# Patient Record
Sex: Male | Born: 1990 | Race: White | Hispanic: No | Marital: Single | Smoking: Current every day smoker
Health system: Southern US, Community
[De-identification: ages and names within clinical notes are randomized; demographics above are authoritative.]

---

## 2014-07-29 ENCOUNTER — Encounter (HOSPITAL_BASED_OUTPATIENT_CLINIC_OR_DEPARTMENT_OTHER): Payer: Self-pay | Admitting: *Deleted

## 2014-07-29 ENCOUNTER — Encounter (HOSPITAL_COMMUNITY): Admission: EM | Disposition: A | Discharge status: Home / Self Care | Payer: Self-pay | Source: Home / Self Care

## 2014-07-29 ENCOUNTER — Observation Stay (HOSPITAL_BASED_OUTPATIENT_CLINIC_OR_DEPARTMENT_OTHER)
Admission: EM | Admit: 2014-07-29 | Discharge: 2014-07-30 | Disposition: A | Discharge status: Home / Self Care | Payer: Self-pay | Attending: Surgery | Admitting: Surgery

## 2014-07-29 ENCOUNTER — Emergency Department (HOSPITAL_BASED_OUTPATIENT_CLINIC_OR_DEPARTMENT_OTHER): Payer: Self-pay

## 2014-07-29 DIAGNOSIS — R509 Fever, unspecified: Secondary | ICD-10-CM | POA: Insufficient documentation

## 2014-07-29 DIAGNOSIS — E7439 Other disorders of intestinal carbohydrate absorption: Secondary | ICD-10-CM | POA: Insufficient documentation

## 2014-07-29 DIAGNOSIS — R1031 Right lower quadrant pain: Secondary | ICD-10-CM | POA: Insufficient documentation

## 2014-07-29 DIAGNOSIS — F319 Bipolar disorder, unspecified: Secondary | ICD-10-CM | POA: Insufficient documentation

## 2014-07-29 DIAGNOSIS — Z79899 Other long term (current) drug therapy: Secondary | ICD-10-CM | POA: Insufficient documentation

## 2014-07-29 DIAGNOSIS — K353 Acute appendicitis with localized peritonitis: Principal | ICD-10-CM | POA: Insufficient documentation

## 2014-07-29 DIAGNOSIS — F17210 Nicotine dependence, cigarettes, uncomplicated: Secondary | ICD-10-CM | POA: Insufficient documentation

## 2014-07-29 DIAGNOSIS — Z880 Allergy status to penicillin: Secondary | ICD-10-CM | POA: Insufficient documentation

## 2014-07-29 DIAGNOSIS — K3580 Unspecified acute appendicitis: Secondary | ICD-10-CM

## 2014-07-29 HISTORY — PX: LAPAROSCOPIC APPENDECTOMY: SHX408

## 2014-07-29 LAB — CBC WITH DIFFERENTIAL/PLATELET
BASOS ABS: 0 10*3/uL (ref 0.0–0.1)
BASOS PCT: 0 % (ref 0–1)
EOS ABS: 0.1 10*3/uL (ref 0.0–0.7)
Eosinophils Relative: 1 % (ref 0–5)
HEMATOCRIT: 40.3 % (ref 39.0–52.0)
Hemoglobin: 13.4 g/dL (ref 13.0–17.0)
LYMPHS ABS: 1.5 10*3/uL (ref 0.7–4.0)
Lymphocytes Relative: 8 % — ABNORMAL LOW (ref 12–46)
MCH: 27.7 pg (ref 26.0–34.0)
MCHC: 33.3 g/dL (ref 30.0–36.0)
MCV: 83.3 fL (ref 78.0–100.0)
Monocytes Absolute: 1.2 10*3/uL — ABNORMAL HIGH (ref 0.1–1.0)
Monocytes Relative: 6 % (ref 3–12)
NEUTROS PCT: 85 % — AB (ref 43–77)
Neutro Abs: 15.7 10*3/uL — ABNORMAL HIGH (ref 1.7–7.7)
Platelets: 230 10*3/uL (ref 150–400)
RBC: 4.84 MIL/uL (ref 4.22–5.81)
RDW: 13.8 % (ref 11.5–15.5)
WBC: 18.6 10*3/uL — ABNORMAL HIGH (ref 4.0–10.5)

## 2014-07-29 LAB — URINALYSIS, ROUTINE W REFLEX MICROSCOPIC
Bilirubin Urine: NEGATIVE
Glucose, UA: NEGATIVE mg/dL
Hgb urine dipstick: NEGATIVE
KETONES UR: NEGATIVE mg/dL
Leukocytes, UA: NEGATIVE
Nitrite: NEGATIVE
PH: 6 (ref 5.0–8.0)
Protein, ur: NEGATIVE mg/dL
Specific Gravity, Urine: 1.025 (ref 1.005–1.030)
Urobilinogen, UA: 0.2 mg/dL (ref 0.0–1.0)

## 2014-07-29 LAB — COMPREHENSIVE METABOLIC PANEL
ALT: 36 U/L (ref 17–63)
ANION GAP: 4 — AB (ref 5–15)
AST: 44 U/L — ABNORMAL HIGH (ref 15–41)
Albumin: 4.2 g/dL (ref 3.5–5.0)
Alkaline Phosphatase: 76 U/L (ref 38–126)
BILIRUBIN TOTAL: 1.4 mg/dL — AB (ref 0.3–1.2)
BUN: 14 mg/dL (ref 6–20)
CHLORIDE: 103 mmol/L (ref 101–111)
CO2: 28 mmol/L (ref 22–32)
CREATININE: 0.93 mg/dL (ref 0.61–1.24)
Calcium: 8.9 mg/dL (ref 8.9–10.3)
GFR calc Af Amer: >60 mL/min (ref >60)
Glucose, Bld: 128 mg/dL — ABNORMAL HIGH (ref 65–99)
Potassium: 4.1 mmol/L (ref 3.5–5.1)
Sodium: 135 mmol/L (ref 135–145)
Total Protein: 7.7 g/dL (ref 6.5–8.1)

## 2014-07-29 LAB — SURGICAL PCR SCREEN
MRSA, PCR: NEGATIVE
STAPHYLOCOCCUS AUREUS: NEGATIVE

## 2014-07-29 LAB — LIPASE, BLOOD: LIPASE: 21 U/L — AB (ref 22–51)

## 2014-07-29 SURGERY — APPENDECTOMY, LAPAROSCOPIC
Anesthesia: General | Site: Abdomen

## 2014-07-29 MED ORDER — METRONIDAZOLE IN NACL 5-0.79 MG/ML-% IV SOLN
500.0000 mg | Freq: Once | INTRAVENOUS | Status: AC
  Administered 2014-07-29: 500 mg via INTRAVENOUS
  Filled 2014-07-29: qty 100

## 2014-07-29 MED ORDER — DEXAMETHASONE SODIUM PHOSPHATE 10 MG/ML IJ SOLN
INTRAMUSCULAR | Status: AC
  Filled 2014-07-29: qty 2

## 2014-07-29 MED ORDER — HYDROMORPHONE HCL 1 MG/ML IJ SOLN
1.0000 mg | Freq: Once | INTRAMUSCULAR | Status: AC
  Administered 2014-07-29: 1 mg via INTRAVENOUS
  Filled 2014-07-29: qty 1

## 2014-07-29 MED ORDER — IOHEXOL 300 MG/ML  SOLN
100.0000 mL | Freq: Once | INTRAMUSCULAR | Status: AC | PRN
  Administered 2014-07-29: 100 mL via INTRAVENOUS

## 2014-07-29 MED ORDER — FENTANYL CITRATE (PF) 100 MCG/2ML IJ SOLN
INTRAMUSCULAR | Status: AC
  Filled 2014-07-29: qty 2

## 2014-07-29 MED ORDER — NEOSTIGMINE METHYLSULFATE 10 MG/10ML IV SOLN
INTRAVENOUS | Status: AC
  Filled 2014-07-29: qty 1

## 2014-07-29 MED ORDER — MORPHINE SULFATE 4 MG/ML IJ SOLN
4.0000 mg | Freq: Once | INTRAMUSCULAR | Status: AC
  Administered 2014-07-29: 4 mg via INTRAVENOUS
  Filled 2014-07-29: qty 1

## 2014-07-29 MED ORDER — ONDANSETRON HCL 4 MG/2ML IJ SOLN
INTRAMUSCULAR | Status: AC
  Filled 2014-07-29: qty 2

## 2014-07-29 MED ORDER — CIPROFLOXACIN IN D5W 400 MG/200ML IV SOLN
400.0000 mg | Freq: Once | INTRAVENOUS | Status: AC
Start: 2014-07-29 — End: 2014-07-30
  Administered 2014-07-30: 400 mg via INTRAVENOUS
  Filled 2014-07-29: qty 200

## 2014-07-29 MED ORDER — FENTANYL CITRATE (PF) 250 MCG/5ML IJ SOLN
INTRAMUSCULAR | Status: AC
  Filled 2014-07-29: qty 5

## 2014-07-29 MED ORDER — GLYCOPYRROLATE 0.2 MG/ML IJ SOLN
INTRAMUSCULAR | Status: AC
  Filled 2014-07-29: qty 2

## 2014-07-29 MED ORDER — IOHEXOL 300 MG/ML  SOLN
25.0000 mL | Freq: Once | INTRAMUSCULAR | Status: AC | PRN
  Administered 2014-07-29: 25 mL via ORAL

## 2014-07-29 MED ORDER — MIDAZOLAM HCL 2 MG/2ML IJ SOLN
INTRAMUSCULAR | Status: AC
  Filled 2014-07-29: qty 2

## 2014-07-29 MED ORDER — BUPIVACAINE-EPINEPHRINE 0.25% -1:200000 IJ SOLN
INTRAMUSCULAR | Status: AC
  Filled 2014-07-29: qty 1

## 2014-07-29 MED ORDER — KCL IN DEXTROSE-NACL 20-5-0.45 MEQ/L-%-% IV SOLN
INTRAVENOUS | Status: DC
  Administered 2014-07-29: 22:00:00 via INTRAVENOUS
  Filled 2014-07-29 (×2): qty 1000

## 2014-07-29 MED ORDER — ONDANSETRON HCL 4 MG/2ML IJ SOLN
4.0000 mg | Freq: Once | INTRAMUSCULAR | Status: AC
  Administered 2014-07-29: 4 mg via INTRAVENOUS
  Filled 2014-07-29: qty 2

## 2014-07-29 MED ORDER — LACTATED RINGERS IV SOLN
INTRAVENOUS | Status: DC | PRN
  Administered 2014-07-29 – 2014-07-30 (×2): via INTRAVENOUS

## 2014-07-29 MED ORDER — ROCURONIUM BROMIDE 100 MG/10ML IV SOLN
INTRAVENOUS | Status: AC
  Filled 2014-07-29: qty 1

## 2014-07-29 MED ORDER — ARTIFICIAL TEARS OP OINT
TOPICAL_OINTMENT | OPHTHALMIC | Status: AC
  Filled 2014-07-29: qty 3.5

## 2014-07-29 SURGICAL SUPPLY — 35 items
APPLIER CLIP ROT 10 11.4 M/L (STAPLE)
BENZOIN TINCTURE PRP APPL 2/3 (GAUZE/BANDAGES/DRESSINGS) ×3 IMPLANT
CLIP APPLIE ROT 10 11.4 M/L (STAPLE) IMPLANT
CLOSURE WOUND 1/2 X4 (GAUZE/BANDAGES/DRESSINGS) ×1
COVER SURGICAL LIGHT HANDLE (MISCELLANEOUS) ×3 IMPLANT
CUTTER FLEX LINEAR 45M (STAPLE) ×3 IMPLANT
DECANTER SPIKE VIAL GLASS SM (MISCELLANEOUS) ×3 IMPLANT
DRAPE LAPAROSCOPIC ABDOMINAL (DRAPES) ×3 IMPLANT
ELECT REM PT RETURN 9FT ADLT (ELECTROSURGICAL) ×3
ELECTRODE REM PT RTRN 9FT ADLT (ELECTROSURGICAL) ×1 IMPLANT
ENDOLOOP SUT PDS II  0 18 (SUTURE)
ENDOLOOP SUT PDS II 0 18 (SUTURE) IMPLANT
GLOVE BIOGEL PI IND STRL 7.0 (GLOVE) ×1 IMPLANT
GLOVE BIOGEL PI INDICATOR 7.0 (GLOVE) ×2
GLOVE SURG ORTHO 8.0 STRL STRW (GLOVE) ×3 IMPLANT
GOWN STRL REUS W/TWL LRG LVL3 (GOWN DISPOSABLE) ×3 IMPLANT
GOWN STRL REUS W/TWL XL LVL3 (GOWN DISPOSABLE) ×6 IMPLANT
KIT BASIN OR (CUSTOM PROCEDURE TRAY) ×3 IMPLANT
PENCIL BUTTON HOLSTER BLD 10FT (ELECTRODE) IMPLANT
POUCH SPECIMEN RETRIEVAL 10MM (ENDOMECHANICALS) ×3 IMPLANT
RELOAD 45 VASCULAR/THIN (ENDOMECHANICALS) IMPLANT
RELOAD STAPLE TA45 3.5 REG BLU (ENDOMECHANICALS) ×3 IMPLANT
SET IRRIG TUBING LAPAROSCOPIC (IRRIGATION / IRRIGATOR) IMPLANT
SHEARS HARMONIC ACE PLUS 36CM (ENDOMECHANICALS) IMPLANT
SOLUTION ANTI FOG 6CC (MISCELLANEOUS) ×3 IMPLANT
STRIP CLOSURE SKIN 1/2X4 (GAUZE/BANDAGES/DRESSINGS) ×2 IMPLANT
SUT MNCRL AB 4-0 PS2 18 (SUTURE) ×3 IMPLANT
TOWEL OR 17X26 10 PK STRL BLUE (TOWEL DISPOSABLE) ×3 IMPLANT
TRAY FOLEY W/METER SILVER 14FR (SET/KITS/TRAYS/PACK) ×3 IMPLANT
TRAY LAPAROSCOPIC (CUSTOM PROCEDURE TRAY) ×3 IMPLANT
TROCAR BLADELESS OPT 5 75 (ENDOMECHANICALS) ×3 IMPLANT
TROCAR XCEL BLUNT TIP 100MML (ENDOMECHANICALS) ×3 IMPLANT
TROCAR XCEL NON-BLD 11X100MML (ENDOMECHANICALS) ×3 IMPLANT
TUBING INSUFFLATION 10FT LAP (TUBING) ×3 IMPLANT
WATER STERILE IRR 1500ML POUR (IV SOLUTION) ×3 IMPLANT

## 2014-07-29 NOTE — ED Provider Notes (Signed)
CSN: [id number]     Arrival date & time 07/29/14  1622 History  This chart was scribed for non-physician practitioner, Kathrynn Speed, PA-C working with Blake Divine, MD by Placido Sou, ED scribe. This patient was seen in room MH11/MH11 and the patient's care was started at 6:31 PM.  Chief Complaint  Patient presents with  . Abdominal Pain   The history is provided by the patient and a parent. No language interpreter was used.    HPI Comments: Curtis Patrick is a 24 y.o. male who presents to the Emergency Department complaining of constant, severe, right sided abd pain with onset at 12:00 pm today. Pt notes that after eating Cheetos he felt sudden onset sharp, 10/10 abd pain with associated nausea and forced himself to vomit 2x before coming to [facility]. Pt denies any hx of abd surgeries. He denies fever or chills. Reports prior to eating that she does, he was playing basketball and noticed some pain on the right side when he was jumping around. No diarrhea. Last bowel movement yesterday and normal.  History reviewed. No pertinent past medical history. History reviewed. No pertinent past surgical history. No family history on file. History  Substance Use Topics  . Smoking status: Current Every Day Smoker  . Smokeless tobacco: Not on file  . Alcohol Use: No    Review of Systems  Constitutional: Negative for fever and chills.  Gastrointestinal: Positive for nausea, vomiting and abdominal pain.  All other systems reviewed and are negative.    Allergies  Penicillins  Home Medications   Prior to Admission medications   Not on File   BP 127/76 mmHg  Pulse 82  Temp(Src) 98.2 F (36.8 C) (Oral)  Resp 16  Ht  (1.753 m)  Wt 180 lb (81.647 kg)  BMI 26.57 kg/m2  SpO2 98% Physical Exam  Constitutional: He is oriented to person, place, and time. He appears well-developed and well-nourished. No distress.  HENT:  Head: Normocephalic and atraumatic.  Eyes: Conjunctivae and EOM  are normal.  Neck: Normal range of motion. Neck supple.  Cardiovascular: Normal rate, regular rhythm and normal heart sounds.   Pulmonary/Chest: Effort normal and breath sounds normal.  Abdominal: There is tenderness in the right upper quadrant and right lower quadrant. There is guarding and tenderness at McBurney's point. There is no rigidity, no rebound, no CVA tenderness and negative Murphy's sign.  No peritoneal signs.  Musculoskeletal: Normal range of motion. He exhibits no edema.  Neurological: He is alert and oriented to person, place, and time.  Skin: Skin is warm and dry.  Psychiatric: He has a normal mood and affect. His behavior is normal.  Nursing note and vitals reviewed.   ED Course  Procedures  DIAGNOSTIC STUDIES: Oxygen Saturation is 98% on RA, normal by my interpretation.    COORDINATION OF CARE: 6:35 PM Discussed treatment plan with pt at bedside and pt agreed to plan. Will obtain CT to rule out appendicitis. Labs pending.  Labs Review Labs Reviewed  CBC WITH DIFFERENTIAL/PLATELET - Abnormal; Notable for the following:    WBC 18.6 (*)    Neutrophils Relative % 85 (*)    Neutro Abs 15.7 (*)    Lymphocytes Relative 8 (*)    Monocytes Absolute 1.2 (*)    All other components within normal limits  COMPREHENSIVE METABOLIC PANEL - Abnormal; Notable for the following:    Glucose, Bld 128 (*)    AST 44 (*)    Total Bilirubin 1.4 (*)  Anion gap 4 (*)    All other components within normal limits  LIPASE, BLOOD - Abnormal; Notable for the following:    Lipase 21 (*)    All other components within normal limits  URINALYSIS, ROUTINE W REFLEX MICROSCOPIC (NOT AT [facility])    Imaging Review Ct Abdomen Pelvis W Contrast  07/29/2014   CLINICAL DATA:  Woke up with right lower quadrant abdominal pain this morning. Emesis.  EXAM: CT ABDOMEN AND PELVIS WITH CONTRAST  TECHNIQUE: Multidetector CT imaging of the abdomen and pelvis was performed using the standard protocol  following bolus administration of intravenous contrast.  CONTRASTMayer CElease HashimDaleen MarlanMayer CElease HashimDaleen MarlandMayer CElease HashimDaleen Marland KitcheHeriGrossnMayer CElease HashimDaleen Marland KitcheMidwest Specialty Surgery Center EArkansas SurgeryMayer CElease HashimDaleen MarlMayer CElease HashimDaleen Marland KitcheSutter [phone]<MEClinch [phone] CElease HashimDaleen Marland KitcheGreenwood [phone]<[id number]<MEASUREMENTLunPam SpecialtyMayer CElease HashimDaleen Marland KitcheShriners HosMayer CElease HashimDaleen Marland KitcheWashington Orthopaedic Center IncE<[phone]<MEASUREMENTLunetJefferson Ambulatory Surgery Center Mayer CElease HashimDaleen Marland KitcheBerstein Hilliker Hartzell Eye Center LLP Dba The Surgery Center Of CeMental Health Services For CMayer CElease HashimDaleen Marland KitMayer CElease HashimDaleen Marland KitMayer CElease HashimDaleen Marland KitchePrisma Health BaptMayer CElease HashimDaleen Marland KitcheFreestone Medical Cen<MEASUREMENTLunette Mayer CElease HashimDaleen Marland KitcheVibra Hospital Of Western Mass Central CamEld<MEASUDr. Pila'S [phone]<MEASUREMENTSouthMayer CElease HashimDaleen Marland KitcheFleming County HospiEldridWayne GMayer CElease HashimDaleen Marland KitcheFitzgibbon HospiEldridge<MEASUREMENTLunette StaLoretha StaElease HashimDaleen Marland KitcheChildrens Healthcare Of Atlanta - Egles<MEASUREMENMayer CElease HashimDaleen Marland KitcheAtlantaLahey Clinic Medical CBaMayer CElease HashimDaleen Marland KitcheSt. Vincent PhysMayer CElease HashimDaleen Marland KitcheAdventist Health Simi ValMayer CElease HashimDaleen Marland KitchMayer CElease HashimDaleen Marland KitcheVibra Hospital Of CMayer CElease HashimDalMcdonald Army CoMayer CElease HashimDaleenMayer CElease HashimDaleen Marland KitcheGreenbriar Rehabilitation Hospi<MEASUREMENTLunette StaLKaiser Fnd Hosp - WalnuMayer CElease HashimDaleen Marland KitcheCrozer-Chester Medical CenEldridgeNadMarketing executiJovitKoreaMaplSkippeFortino Sicuine Parts70-77BeverlyCharlton Hawsieni Bersds LLC Dba Regional18mSBeverely P s, pleural effusions, or infiltrates. Heart size is normal. No imaged pericardial effusion or significant coronary artery calcifications.  Upper abdomen: No focal abnormality identified within the liver, spleen, pancreas, or adrenal glands. Gallbladder is present. Kidneys are symmetric. No hydronephrosis.  Gastrointestinal tract: Stomach and small bowel loops are normal in appearance. Adjacent to the cecum there is a short tubular structure likely representing a dilated appendix. Appendix measures 11 mm There is a small amount of stranding in this region. Findings are consistent with acute appendicitis. The colonic loops are normal in appearance.  Pelvis: Urinary bladder, prostate, and seminal vesicles are normal in appearance. No free pelvic fluid.  Retroperitoneum: No retroperitoneal or mesenteric adenopathy. No evidence for aortic aneurysm.  Abdominal wall: Unremarkable.  Osseous structures: Unremarkable.  IMPRESSION: 1. Findings consistent with acute appendicitis. 2. No associated abscess. 3. No free pelvic fluid.  The salient findings were discussed with Dr. Wofford 07/29/2014 at 7:33 pm.   Electronically Signed   By: Elizabeth  Brown M.D.   On: 07/29/2014 19:34     EKG Interpretation None      MDM   Final diagnoses:  Acute appendicitis, uncomplicated   CT confirmed acute appendicitis without abscess or perforation. No free fluid. I spoke with Dr. Gerkin, general surgery who accepts patient for transfer to [place]. Stable for transfer. Prophylactic antibiotics started. Allergy to PCN, given Flagyl and Cipro.  Discussed with attending Dr. Wofford who agrees with plan of care.  I personally performed the services described in this documentation, which was scribed in my presence. The  recorded information has been reviewed and is accurate.  Tamitha Norell M Miron Marxen, PA-C 07/29/14 1953  John Wofford, MD 07/30/14 1537

## 2014-07-29 NOTE — ED Notes (Signed)
Pt reports abd pain with n/v since 1200 after eating cheetos- last BM this morning- c/o abdomen feeling distended and bloated

## 2014-07-29 NOTE — ED Notes (Signed)
Pt c/o abdominal pain and vomiting that began today. He sts he was sleeping and woke up having an episode of tachycardia.

## 2014-07-29 NOTE — H&P (Signed)
Curtis Patrick is an 24 y.o. male.    General Surgery [facility]  Chief Complaint: abdominal pain, acute appendicitis  HPI: patient is a 24 yo male with less than 12 hour hx of abdominal pain which started while playing basketball this afternoon.  Patient developed nausea and emesis.  Pain now in RLQ of abdomen.  Presented to [place] for evaluation - elevated WBC 18K; CTA positive acute appendicitis.  No prior abdominal surgery.  No medical problems.  Allergic to PCN.  History reviewed. No pertinent past medical history.  History reviewed. No pertinent past surgical history.  History reviewed. No pertinent family history. Social History:  reports that he has been smoking.  He does not have any smokeless tobacco history on file. He reports that he does not drink alcohol or use illicit drugs.  Allergies:  Allergies  Allergen Reactions  . Penicillins Anaphylaxis    No prescriptions prior to admission    Results for orders placed or performed during the hospital encounter of 07/29/14 (from the past 48 hour(s))  Urinalysis, Routine w reflex microscopic (not at [facility])     Status: None   Collection Time: 07/29/14  4:22 PM  Result Value Ref Range   Color, Urine YELLOW YELLOW   APPearance CLEAR CLEAR   Specific Gravity, Urine 1.025 1.005 - 1.030   pH 6.0 5.0 - 8.0   Glucose, UA NEGATIVE NEGATIVE mg/dL   Hgb urine dipstick NEGATIVE NEGATIVE   Bilirubin Urine NEGATIVE NEGATIVE   Ketones, ur NEGATIVE NEGATIVE mg/dL   Protein, ur NEGATIVE NEGATIVE mg/dL   Urobilinogen, UA 0.2 0.0 - 1.0 mg/dL   Nitrite NEGATIVE NEGATIVE   Leukocytes, UA NEGATIVE NEGATIVE    Comment: MICROSCOPIC NOT DONE ON URINES WITH NEGATIVE PROTEIN, BLOOD, LEUKOCYTES, NITRITE, OR GLUCOSE <1000 mg/dL.  CBC with Differential/Platelet     Status: Abnormal   Collection Time: 07/29/14  7:00 PM  Result Value Ref Range   WBC 18.6 (H) 4.0 - 10.5 K/uL   RBC 4.84 4.22 - 5.81 MIL/uL   Hemoglobin  13.4 13.0 - 17.0 g/dL   HCT 40.3 39.0 - 52.0 %   MCV 83.3 78.0 - 100.0 fL   MCH 27.7 26.0 - 34.0 pg   MCHC 33.3 30.0 - 36.0 g/dL   RDW 13.8 11.5 - 15.5 %   Platelets 230 150 - 400 K/uL   Neutrophils Relative % 85 (H) 43 - 77 %   Neutro Abs 15.7 (H) 1.7 - 7.7 K/uL   Lymphocytes Relative 8 (L) 12 - 46 %   Lymphs Abs 1.5 0.7 - 4.0 K/uL   Monocytes Relative 6 3 - 12 %   Monocytes Absolute 1.2 (H) 0.1 - 1.0 K/uL   Eosinophils Relative 1 0 - 5 %   Eosinophils Absolute 0.1 0.0 - 0.7 K/uL   Basophils Relative 0 0 - 1 %   Basophils Absolute 0.0 0.0 - 0.1 K/uL  Comprehensive metabolic panel     Status: Abnormal   Collection Time: 07/29/14  7:00 PM  Result Value Ref Range   Sodium 135 135 - 145 mmol/L   Potassium 4.1 3.5 - 5.1 mmol/L   Chloride 103 101 - 111 mmol/L   CO2 28 22 - 32 mmol/L   Glucose, Bld 128 (H) 65 - 99 mg/dL   BUN 14 6 - 20 mg/dL   Creatinine, Ser 0.93 0.61 - 1.24 mg/dL   Calcium 8.9 8.9 - 10.3 mg/dL   Total Protein 7.7 6.5 -  8.1 g/dL   Albumin 4.2 3.5 - 5.0 g/dL   AST 44 (H) 15 - 41 U/L   ALT 36 17 - 63 U/L   Alkaline Phosphatase 76 38 - 126 U/L   Total Bilirubin 1.4 (H) 0.3 - 1.2 mg/dL   GFR calc non Af Amer >60 >60 mL/min   GFR calc Af Amer >60 >60 mL/min    Comment: (NOTE) The eGFR has been calculated using the CKD EPI equation. This calculation has not been validated in all clinical situations. eGFR's persistently <60 mL/min signify possible Chronic Kidney Disease.    Anion gap 4 (L) 5 - 15  Lipase, blood     Status: Abnormal   Collection Time: 07/29/14  7:00 PM  Result Value Ref Range   Lipase 21 (L) 22 - 51 U/L   Ct Abdomen Pelvis W Contrast  07/29/2014   CLINICAL DATA:  Woke up with right lower quadrant abdominal pain this morning. Emesis.  EXAM: CT ABDOMEN AND PELVIS WITH CONTRAST  TECHNIQUE: Multidetector CT imaging of the abdomen and pelvis was performed using the standard protocol following bolus administration of intravenous contrast.  CONTRAST:   144m OMNIPAQUE IOHEXOL 300 MG/ML SOLN, 247mOMNIPAQUE IOHEXOL 300 MG/ML SOLN  COMPARISON:  None.  FINDINGS: Lower chest: No pulmonary nodules, pleural effusions, or infiltrates. Heart size is normal. No imaged pericardial effusion or significant coronary artery calcifications.  Upper abdomen: No focal abnormality identified within the liver, spleen, pancreas, or adrenal glands. Gallbladder is present. Kidneys are symmetric. No hydronephrosis.  Gastrointestinal tract: Stomach and small bowel loops are normal in appearance. Adjacent to the cecum there is a short tubular structure likely representing a dilated appendix. Appendix measures 11 mm There is a small amount of stranding in this region. Findings are consistent with acute appendicitis. The colonic loops are normal in appearance.  Pelvis: Urinary bladder, prostate, and seminal vesicles are normal in appearance. No free pelvic fluid.  Retroperitoneum: No retroperitoneal or mesenteric adenopathy. No evidence for aortic aneurysm.  Abdominal wall: Unremarkable.  Osseous structures: Unremarkable.  IMPRESSION: 1. Findings consistent with acute appendicitis. 2. No associated abscess. 3. No free pelvic fluid.  The salient findings were discussed with Dr. WoDoy Mince/25/2016 at 7:33 pm.   Electronically Signed   By: ElNolon Nations.D.   On: 07/29/2014 19:34    Review of Systems  Constitutional: Positive for fever. Negative for chills, weight loss, malaise/fatigue and diaphoresis.  HENT: Negative.   Eyes: Negative.   Respiratory: Negative.   Cardiovascular: Negative.   Gastrointestinal: Positive for nausea, vomiting and abdominal pain (RLQ). Negative for diarrhea.  Genitourinary: Negative.   Musculoskeletal: Negative.   Skin: Negative.   Neurological: Negative.  Negative for weakness.  Endo/Heme/Allergies: Negative.   Psychiatric/Behavioral: Negative.     Blood pressure 113/55, pulse 65, temperature 99.1 F (37.3 C), temperature source Oral, resp.  rate 18, height '5\' 9"'  (1.753 m), weight 81.647 kg (180 lb), SpO2 98 %. Physical Exam  Constitutional: He is oriented to person, place, and time. He appears well-developed and well-nourished. No distress.  HENT:  Head: Normocephalic and atraumatic.  Right Ear: External ear normal.  Left Ear: External ear normal.  Mouth/Throat: No oropharyngeal exudate.  Eyes: Conjunctivae are normal. Pupils are equal, round, and reactive to light. No scleral icterus.  Neck: Normal range of motion. Neck supple. No tracheal deviation present. No thyromegaly present.  Cardiovascular: Normal rate, regular rhythm and normal heart sounds.   No murmur heard. Respiratory: Effort normal and breath  sounds normal. No respiratory distress. He has no wheezes.  GI: Soft. Bowel sounds are normal. He exhibits no distension and no mass. There is tenderness (RLQ). There is guarding. There is no rebound.  Musculoskeletal: Normal range of motion. He exhibits no edema.  Neurological: He is alert and oriented to person, place, and time.  Skin: Skin is warm and dry.  Psychiatric: He has a normal mood and affect. His behavior is normal.     Assessment/Plan Acute appendicitis  Admit to general surgery service  Plan Lap appendectomy this evening  Begin IV abx  The risks and benefits of the procedure have been discussed at length with the patient.  The patient understands the proposed procedure, potential alternative treatments, and the course of recovery to be expected.  All of the patient's questions have been answered at this time.  The patient wishes to proceed with surgery.  Earnstine Regal, MD, [facility] Office: [place] 07/29/2014, 10:01 PM

## 2014-07-29 NOTE — ED Notes (Signed)
Patient transported to CT 

## 2014-07-29 NOTE — Anesthesia Preprocedure Evaluation (Addendum)
Anesthesia Evaluation  Patient identified by MRN, date of birth, ID band Patient awake    Reviewed: Allergy & Precautions, NPO status , Patient's Chart, lab work & pertinent test results  Airway Mallampati: II  TM Distance: >3 FB Neck ROM: Full    Dental no notable dental hx.    Pulmonary Current Smoker,  breath sounds clear to auscultation  Pulmonary exam normal       Cardiovascular Exercise Tolerance: Good negative cardio ROS Normal cardiovascular examRhythm:Regular Rate:Normal     Neuro/Psych Bipolar Disorder He is on Lithium and Zyprexa for bipolar disorder.negative neurological ROS     GI/Hepatic negative GI ROS, Neg liver ROS,   Endo/Other  Mr. Snider denies diabetes but he is apparently on Metformin 500 mg BID for glucose intolerance.  Renal/GU negative Renal ROS  negative genitourinary   Musculoskeletal negative musculoskeletal ROS (+)   Abdominal   Peds negative pediatric ROS (+)  Hematology negative hematology ROS (+)   Anesthesia Other Findings   Reproductive/Obstetrics negative OB ROS                           Anesthesia Physical Anesthesia Plan  ASA: II and emergent  Anesthesia Plan: General   Post-op Pain Management:    Induction: Intravenous  Airway Management Planned: Oral ETT  Additional Equipment:   Intra-op Plan:   Post-operative Plan: Extubation in OR  Informed Consent: I have reviewed the patients History and Physical, chart, labs and discussed the procedure including the risks, benefits and alternatives for the proposed anesthesia with the patient or authorized representative who has indicated his/her understanding and acceptance.   Dental advisory given  Plan Discussed with: CRNA  Anesthesia Plan Comments:         Anesthesia Quick Evaluation

## 2014-07-30 ENCOUNTER — Observation Stay (HOSPITAL_COMMUNITY): Payer: Self-pay | Admitting: Anesthesiology

## 2014-07-30 ENCOUNTER — Encounter (HOSPITAL_COMMUNITY): Payer: Self-pay | Admitting: Surgery

## 2014-07-30 LAB — GLUCOSE, CAPILLARY: Glucose-Capillary: 177 mg/dL — ABNORMAL HIGH (ref 65–99)

## 2014-07-30 MED ORDER — KCL IN DEXTROSE-NACL 30-5-0.45 MEQ/L-%-% IV SOLN
INTRAVENOUS | Status: DC
  Administered 2014-07-30: 05:00:00 via INTRAVENOUS
  Filled 2014-07-30 (×3): qty 1000

## 2014-07-30 MED ORDER — ONDANSETRON HCL 4 MG/2ML IJ SOLN: 4.0000 mg | Freq: Four times a day (QID) | INTRAMUSCULAR | Status: DC | PRN

## 2014-07-30 MED ORDER — IBUPROFEN 200 MG PO TABS: 600.0000 mg | ORAL_TABLET | Freq: Four times a day (QID) | ORAL | Status: DC | PRN

## 2014-07-30 MED ORDER — FENTANYL CITRATE (PF) 250 MCG/5ML IJ SOLN
INTRAMUSCULAR | Status: DC | PRN
  Administered 2014-07-29: 25 ug via INTRAVENOUS
  Administered 2014-07-30: 75 ug via INTRAVENOUS

## 2014-07-30 MED ORDER — HYDROMORPHONE HCL 1 MG/ML IJ SOLN: 0.2500 mg | INTRAMUSCULAR | Status: DC | PRN

## 2014-07-30 MED ORDER — DEXAMETHASONE SODIUM PHOSPHATE 10 MG/ML IJ SOLN
INTRAMUSCULAR | Status: DC | PRN
  Administered 2014-07-30: 10 mg via INTRAVENOUS

## 2014-07-30 MED ORDER — PROPOFOL 10 MG/ML IV BOLUS
INTRAVENOUS | Status: DC | PRN
  Administered 2014-07-30: 190 mg via INTRAVENOUS

## 2014-07-30 MED ORDER — CIPROFLOXACIN IN D5W 400 MG/200ML IV SOLN
INTRAVENOUS | Status: AC
  Filled 2014-07-30: qty 200

## 2014-07-30 MED ORDER — INSULIN ASPART 100 UNIT/ML ~~LOC~~ SOLN
0.0000 [IU] | Freq: Three times a day (TID) | SUBCUTANEOUS | Status: DC
Start: 2014-07-30 — End: 2014-07-30
  Administered 2014-07-30: 3 [IU] via SUBCUTANEOUS

## 2014-07-30 MED ORDER — NEOSTIGMINE METHYLSULFATE 10 MG/10ML IV SOLN
INTRAVENOUS | Status: DC | PRN
  Administered 2014-07-30: 4 mg via INTRAVENOUS

## 2014-07-30 MED ORDER — FENTANYL CITRATE (PF) 100 MCG/2ML IJ SOLN
INTRAMUSCULAR | Status: DC | PRN
  Administered 2014-07-30: 100 ug via INTRAVENOUS

## 2014-07-30 MED ORDER — PROMETHAZINE HCL 25 MG/ML IJ SOLN
6.2500 mg | INTRAMUSCULAR | Status: DC | PRN
Start: 2014-07-30 — End: 2014-07-30

## 2014-07-30 MED ORDER — HYDROCODONE-ACETAMINOPHEN 5-325 MG PO TABS: 1.0000 | ORAL_TABLET | ORAL | Status: AC | PRN

## 2014-07-30 MED ORDER — MIDAZOLAM HCL 5 MG/5ML IJ SOLN
INTRAMUSCULAR | Status: DC | PRN
  Administered 2014-07-30: 2 mg via INTRAVENOUS

## 2014-07-30 MED ORDER — ROCURONIUM BROMIDE 100 MG/10ML IV SOLN
INTRAVENOUS | Status: DC | PRN
  Administered 2014-07-30: 20 mg via INTRAVENOUS

## 2014-07-30 MED ORDER — METRONIDAZOLE IN NACL 5-0.79 MG/ML-% IV SOLN
500.0000 mg | Freq: Three times a day (TID) | INTRAVENOUS | Status: DC
  Administered 2014-07-30: 500 mg via INTRAVENOUS
  Filled 2014-07-30 (×2): qty 100

## 2014-07-30 MED ORDER — LACTATED RINGERS IV SOLN
INTRAVENOUS | Status: DC | PRN
  Administered 2014-07-30: 1000 mL

## 2014-07-30 MED ORDER — GLYCOPYRROLATE 0.2 MG/ML IJ SOLN
INTRAMUSCULAR | Status: AC
  Filled 2014-07-30: qty 3

## 2014-07-30 MED ORDER — BUPIVACAINE-EPINEPHRINE 0.25% -1:200000 IJ SOLN
INTRAMUSCULAR | Status: DC | PRN
  Administered 2014-07-30: 16 mL

## 2014-07-30 MED ORDER — ACETAMINOPHEN 325 MG PO TABS: 650.0000 mg | ORAL_TABLET | Freq: Four times a day (QID) | ORAL | Status: AC | PRN

## 2014-07-30 MED ORDER — CIPROFLOXACIN IN D5W 400 MG/200ML IV SOLN
400.0000 mg | Freq: Two times a day (BID) | INTRAVENOUS | Status: AC
  Administered 2014-07-30: 400 mg via INTRAVENOUS
  Filled 2014-07-30: qty 200

## 2014-07-30 MED ORDER — HYDROCODONE-ACETAMINOPHEN 5-325 MG PO TABS
1.0000 | ORAL_TABLET | ORAL | Status: DC | PRN
  Administered 2014-07-30: 2 via ORAL
  Filled 2014-07-30: qty 2

## 2014-07-30 MED ORDER — IBUPROFEN 200 MG PO TABS: ORAL_TABLET | ORAL | Status: AC

## 2014-07-30 MED ORDER — LIDOCAINE HCL (CARDIAC) 20 MG/ML IV SOLN
INTRAVENOUS | Status: DC | PRN
  Administered 2014-07-30: 50 mg via INTRAVENOUS

## 2014-07-30 MED ORDER — SUCCINYLCHOLINE CHLORIDE 20 MG/ML IJ SOLN
INTRAMUSCULAR | Status: DC | PRN
  Administered 2014-07-30: 100 mg via INTRAVENOUS

## 2014-07-30 MED ORDER — ONDANSETRON HCL 4 MG/2ML IJ SOLN
INTRAMUSCULAR | Status: DC | PRN
  Administered 2014-07-30: 4 mg via INTRAVENOUS

## 2014-07-30 MED ORDER — MORPHINE SULFATE 2 MG/ML IJ SOLN: 1.0000 mg | INTRAMUSCULAR | Status: DC | PRN

## 2014-07-30 MED ORDER — GLYCOPYRROLATE 0.2 MG/ML IJ SOLN
INTRAMUSCULAR | Status: AC
Start: 2014-07-30 — End: 2014-07-30
  Filled 2014-07-30: qty 4

## 2014-07-30 MED ORDER — ONDANSETRON HCL 4 MG PO TABS: 4.0000 mg | ORAL_TABLET | Freq: Four times a day (QID) | ORAL | Status: DC | PRN

## 2014-07-30 MED ORDER — GLYCOPYRROLATE 0.2 MG/ML IJ SOLN
INTRAMUSCULAR | Status: DC | PRN
  Administered 2014-07-30: 0.6 mg via INTRAVENOUS

## 2014-07-30 NOTE — Progress Notes (Signed)
1 Day Post-Op  Subjective: He looks fine, i took one dressing down and it looks fine.    Objective: Vital signs in last 24 hours: Temp:  [97.8 F (36.6 C)-99.1 F (37.3 C)] 97.8 F (36.6 C) (07/26 0435) Pulse Rate:  [56-82] 59 (07/26 0539) Resp:  [14-19] 18 (07/26 0539) BP: (91-133)/(44-92) 133/92 mmHg (07/26 0539) SpO2:  [93 %-100 %] 100 % (07/26 0539) FiO2 (%):  [2 %-8 %] 2 % (07/26 0200) Weight:  [81.647 kg (180 lb)] 81.647 kg (180 lb) (07/25 1626) Last BM Date: 07/29/14 Afebrile, VSS Intake/Output from previous day: 07/25 0701 - 07/26 0700 In: 1000 [I.V.:1000] Out: 1215 [Urine:1200; Blood:15] Intake/Output this shift:    General appearance: alert, cooperative and no distress GI: soft sore dressing ok   Lab Results:   Recent Labs  07/29/14 1900  WBC 18.6*  HGB 13.4  HCT 40.3  PLT 230    BMET  Recent Labs  07/29/14 1900  NA 135  K 4.1  CL 103  CO2 28  GLUCOSE 128*  BUN 14  CREATININE 0.93  CALCIUM 8.9   PT/INR No results for input(s): LABPROT, INR in the last 72 hours.   Recent Labs Lab 07/29/14 1900  AST 44*  ALT 36  ALKPHOS 76  BILITOT 1.4*  PROT 7.7  ALBUMIN 4.2     Lipase     Component Value Date/Time   LIPASE 21* 07/29/2014 1900     Studies/Results: Ct Abdomen Pelvis W Contrast  07/29/2014   CLINICAL DATA:  Woke up with right lower quadrant abdominal pain this morning. Emesis.  EXAM: CT ABDOMEN AND PELVIS WITH CONTRAST  TECHNIQUE: Multidetector CT imaging of the abdomen and pelvis was performed using the standard protocol following bolus administration of intravenous contrast.  CONTRAST:  OMNIPAQUE IOHEXOL 300 MG/ML SOLN, 25mL OMNIPAQUE IOHEXOL 300 MG/ML SOLN  COMPARISON:  None.  FINDINGS: Lower chest: No pulmonary nodules, pleural effusions, or infiltrates. Heart size is normal. No imaged pericardial effusion or significant coronary artery calcifications.  Upper abdomen: No focal abnormality identified within the liver, spleen,  pancreas, or adrenal glands. Gallbladder is present. Kidneys are symmetric. No hydronephrosis.  Gastrointestinal tract: Stomach and small bowel loops are normal in appearance. Adjacent to the cecum there is a short tubular structure likely representing a dilated appendix. Appendix measures 11 mm There is a small amount of stranding in this region. Findings are consistent with acute appendicitis. The colonic loops are normal in appearance.  Pelvis: Urinary bladder, prostate, and seminal vesicles are normal in appearance. No free pelvic fluid.  Retroperitoneum: No retroperitoneal or mesenteric adenopathy. No evidence for aortic aneurysm.  Abdominal wall: Unremarkable.  Osseous structures: Unremarkable.  IMPRESSION: 1. Findings consistent with acute appendicitis. 2. No associated abscess. 3. No free pelvic fluid.  The salient findings were discussed with Dr. Loretha Stapler 07/29/2014 at 7:33 pm.   Electronically Signed   By: Norva Pavlov M.D.   On: 07/29/2014 19:34    Medications: . ciprofloxacin  400 mg Intravenous Q12H  . insulin aspart  0-15 Units Subcutaneous TID WC  . metronidazole  500 mg Intravenous Q8H   Medications Discontinued During This Encounter  Medication Reason  . bupivacaine-EPINEPHrine (MARCAINE W/ EPI) 0.25% -1:200000 (with pres) injection Patient Discharge  . lactated ringers infusion Patient Discharge  . HYDROmorphone (DILAUDID) injection 0.25-0.5 mg Patient Transfer  . promethazine (PHENERGAN) injection 6.25-12.5 mg Patient Transfer  . dextrose 5 % and 0.45 % NaCl with KCl 20 mEq/L infusion  Prior to Admission medications   Medication Sig Start Date End Date Taking? Authorizing Provider  Multiple Vitamin (MULTIVITAMIN WITH MINERALS) TABS tablet Take 1 tablet by mouth daily.   Yes Historical Provider, MD  OLANZapine (ZYPREXA PO) Take 2 tablets by mouth 2 (two) times daily.   Yes Historical Provider, MD    Assessment/Plan Acute appendicitis Laparoscopic appendectomy, 07/29/14,  Dr Gerrit Friends   Plan:  Home later this AM, he is calling family.    LOS: 1 day    Chioma Mukherjee 07/30/2014

## 2014-07-30 NOTE — Transfer of Care (Signed)
Immediate Anesthesia Transfer of Care Note  Patient: Curtis Patrick  Procedure(s) Performed: Procedure(s): APPENDECTOMY LAPAROSCOPIC (N/A)  Patient Location: PACU  Anesthesia Type:General  Level of Consciousness:  sedated, patient cooperative and responds to stimulation  Airway & Oxygen Therapy:Patient Spontanous Breathing and Patient connected to face mask oxgen  Post-op Assessment:  Report given to PACU RN and Post -op Vital signs reviewed and stable  Post vital signs:  Reviewed and stable  Last Vitals:  Filed Vitals:   07/29/14 2150  BP: 113/55  Pulse: 65  Temp:   Resp: 18    Complications: No apparent anesthesia complications

## 2014-07-30 NOTE — Discharge Instructions (Signed)
Laparoscopic Appendectomy °Care After °Refer to this sheet in the next few weeks. These instructions provide you with information on caring for yourself after your procedure. Your caregiver may also give you more specific instructions. Your treatment has been planned according to current medical practices, but problems sometimes occur. Call your caregiver if you have any problems or questions after your procedure. °HOME CARE INSTRUCTIONS °· Do not drive while taking narcotic pain medicines. °· Use stool softener if you become constipated from your pain medicines. °· Change your bandages (dressings) as directed. °· Keep your wounds clean and dry. You may wash the wounds gently with soap and water. Gently pat the wounds dry with a clean towel. °· Do not take baths, swim, or use hot tubs for 10 days, or as instructed by your caregiver. °· Only take over-the-counter or prescription medicines for pain, discomfort, or fever as directed by your caregiver. °· You may continue your normal diet as directed. °· Do not lift more than 10 pounds (4.5 kg) or play contact sports for 3 weeks, or as directed. °· Slowly increase your activity after surgery. °· Take deep breaths to avoid getting a lung infection (pneumonia). °SEEK MEDICAL CARE IF: °· You have redness, swelling, or increasing pain in your wounds. °· You have pus coming from your wounds. °· You have drainage from a wound that lasts longer than 1 day. °· You notice a bad smell coming from the wounds or dressing. °· Your wound edges break open after stitches (sutures) have been removed. °· You notice increasing pain in the shoulders (shoulder strap areas) or near your shoulder blades. °· You develop dizzy episodes or fainting while standing. °· You develop shortness of breath. °· You develop persistent nausea or vomiting. °· You cannot control your bowel functions or lose your appetite. °· You develop diarrhea. °SEEK IMMEDIATE MEDICAL CARE IF:  °· You have a fever. °· You  develop a rash. °· You have difficulty breathing or sharp pains in your chest. °· You develop any reaction or side effects to medicines given. °MAKE SURE YOU: °· Understand these instructions. °· Will watch your condition. °· Will get help right away if you are not doing well or get worse. °Document Released: 12/21/2004 Document Revised: 03/15/2011 Document Reviewed: 06/30/2010 °ExitCare® Patient Information ©2015 [employer]. This information is not intended to replace advice given to you by your health care provider. Make sure you discuss any questions you have with your health care provider. °[facility] ______[facility] °LAPAROSCOPIC SURGERY: POST OP INSTRUCTIONS °Always review your discharge instruction sheet given to you by the facility where your surgery was performed. °IF YOU HAVE DISABILITY OR FAMILY LEAVE FORMS, YOU MUST BRING THEM TO THE OFFICE FOR PROCESSING.   °DO NOT GIVE THEM TO YOUR DOCTOR. ° °1. A prescription for pain medication may be given to you upon discharge.  Take your pain medication as prescribed, if needed.  If narcotic pain medicine is not needed, then you may take acetaminophen (Tylenol) or ibuprofen (Advil) as needed. °2. Take your usually prescribed medications unless otherwise directed. °3. If you need a refill on your pain medication, please contact your pharmacy.  They will contact our office to request authorization. Prescriptions will not be filled after 5pm or on week-ends. °4. You should follow a light diet the first few days after arrival home, such as soup and crackers, etc.  Be sure to include lots of fluids daily. °5. Most patients will experience some swelling and bruising   in the area of the incisions.  Ice packs will help.  Swelling and bruising can take several days to resolve.  °6. It is common to experience some constipation if taking pain medication after surgery.  Increasing fluid intake and taking a stool softener (such as Colace) will usually help or  prevent this problem from occurring.  A mild laxative (Milk of Magnesia or Miralax) should be taken according to package instructions if there are no bowel movements after 48 hours. °7. Unless discharge instructions indicate otherwise, you may remove your bandages 24-48 hours after surgery, and you may shower at that time.  You may have steri-strips (small skin tapes) in place directly over the incision.  These strips should be left on the skin for 7-10 days.  If your surgeon used skin glue on the incision, you may shower in 24 hours.  The glue will flake off over the next 2-3 weeks.  Any sutures or staples will be removed at the office during your follow-up visit. °8. ACTIVITIES:  You may resume regular (light) daily activities beginning the next day--such as daily self-care, walking, climbing stairs--gradually increasing activities as tolerated.  You may have sexual intercourse when it is comfortable.  Refrain from any heavy lifting or straining until approved by your doctor. °a. You may drive when you are no longer taking prescription pain medication, you can comfortably wear a seatbelt, and you can safely maneuver your car and apply brakes. °b. RETURN TO WORK:  __________________________________________________________ °9. You should see your doctor in the office for a follow-up appointment approximately 2-3 weeks after your surgery.  Make sure that you call for this appointment within a day or two after you arrive home to insure a convenient appointment time. °10. OTHER INSTRUCTIONS: __________________________________________________________________________________________________________________________ __________________________________________________________________________________________________________________________ °WHEN TO CALL YOUR DOCTOR: °1. Fever over 101.0 °2. Inability to urinate °3. Continued bleeding from incision. °4. Increased pain, redness, or drainage from the incision. °5. Increasing  abdominal pain ° °The clinic staff is available to answer your questions during regular business hours.  Please don’t hesitate to call and ask to speak to one of the nurses for clinical concerns.  If you have a medical emergency, go to the nearest emergency room or call 911.  A surgeon from [facility] is always on call at the hospital. °[address], [address], [place], [place]  [address] ? [address] [address], [place], [place]   [address] °[phone] ? [phone] ? FAX [phone] °Web site: [email or web address] ° °

## 2014-07-30 NOTE — Anesthesia Procedure Notes (Signed)
Procedure Name: Intubation Date/Time: 07/30/2014 12:17 AM Performed by: Maze Corniel, Nuala Alpha Pre-anesthesia Checklist: Patient identified, Emergency Drugs available, Suction available, Patient being monitored and Timeout performed Patient Re-evaluated:Patient Re-evaluated prior to inductionOxygen Delivery Method: Circle system utilized Preoxygenation: Pre-oxygenation with 100% oxygen Intubation Type: IV induction Ventilation: Mask ventilation without difficulty Laryngoscope Size: Mac and 4 Grade View: Grade II Tube type: Oral Tube size: 7.5 mm Number of attempts: 1 Airway Equipment and Method: Stylet Placement Confirmation: ETT inserted through vocal cords under direct vision,  positive ETCO2,  CO2 detector and breath sounds checked- equal and bilateral Secured at: 22 cm Tube secured with: Tape Dental Injury: Teeth and Oropharynx as per pre-operative assessment

## 2014-07-30 NOTE — Anesthesia Postprocedure Evaluation (Signed)
  Anesthesia Post-op Note  Patient: Curtis Patrick  Procedure(s) Performed: Procedure(s) (LRB): APPENDECTOMY LAPAROSCOPIC (N/A)  Patient Location: PACU  Anesthesia Type: General  Level of Consciousness: awake and alert   Airway and Oxygen Therapy: Patient Spontanous Breathing  Post-op Pain: mild  Post-op Assessment: Post-op Vital signs reviewed, Patient's Cardiovascular Status Stable, Respiratory Function Stable, Patent Airway and No signs of Nausea or vomiting  Last Vitals:  Filed Vitals:   07/30/14 1019  BP: 128/65  Pulse: 72  Temp: 36.3 C  Resp: 18    Post-op Vital Signs: stable   Complications: No apparent anesthesia complications

## 2014-07-30 NOTE — Op Note (Signed)
OPERATIVE REPORT - LAPAROSCOPIC APPENDECTOMY  Preop diagnosis: Acute appendicitis  Postop diagnosis: Same  Procedure: Laparoscopic appendectomy  Surgeon:  Velora Heckler, MD, FACS  Anesthesia: General endotracheal  Estimated blood loss: Minimal  Preparation: Chlora-prep  Complications: None  Indications:  patient is a 24 yo male with less than 12 hour hx of abdominal pain which started while playing basketball this afternoon. Patient developed nausea and emesis. Pain now in RLQ of abdomen. Presented to [facility] for evaluation - elevated WBC 18K; CTA positive acute appendicitis.  Procedure:  Patient is brought to the operating room and placed in a supine position on the operating room table. Following administration of general anesthesia, a time out was held and the patient's name and procedure is confirmed. Patient is then prepped and draped in the usual strict aseptic fashion.  After ascertaining that an adequate level of anesthesia has been achieved, a peri-umbilical incision is made with a #15 blade. Dissection is carried down to the fascia. Fascia is incised in the midline and the peritoneal cavity is entered cautiously. A #0-vicryl pursestring suture is placed in the fascia. An Hassan cannula is introduced under direct vision and secured with the pursestring suture. The abdomen is insufflated with carbon dioxide. The laparoscope is introduced and the abdomen is explored. Operative ports are placed in the right upper quadrant and left lower quadrant. The appendix is identified. The mesoappendix is divided with the harmonic scalpel. Dissection is carried down to the base of the appendix. The base of the appendix is dissected out clearing the junction with the cecal wall. Using an Endo-GIA stapler, the base of the appendix is transected at the junction with the cecal wall. There is good approximation of tissue along the staple line. There is good hemostasis along the staple line. The  appendix is placed into an endo-catch bag and withdrawn through the umbilical port. The #0-vicryl pursestring suture is tied securely.  Right lower quadrant is irrigated with warm saline which is evacuated. Good hemostasis is noted. Ports are removed under direct vision. Good hemostasis is noted at the port sites. Pneumoperitoneum is released.  Skin incisions are anesthetized with local anesthetic. Wounds are closed with interrupted 4-0 Monocryl subcuticular sutures. Wounds are washed and dried and benzoin and Steri-Strips are applied. Dressings are applied. The patient is awakened from anesthesia and brought to the recovery room. The patient tolerated the procedure well.  Velora Heckler, MD, [facility] Office: [phone]

## 2014-07-30 NOTE — Discharge Summary (Signed)
Physician Discharge Summary  Patient ID: Curtis Patrick MRN: [id number] DOB/AGE: 1990-07-22 24 y.o.  Admit date: 07/29/2014 Discharge date: 07/30/2014  Admission Diagnoses:  Acute appendicitis  Discharge Diagnoses:  Acute appendicitis  Principal Problem:   Acute appendicitis   PROCEDURES: laparoscopic appendectomy 07/29/14  Hospital Course:  patient is a 24 yo male with less than 12 hour hx of abdominal pain which started while playing basketball this afternoon. Patient developed nausea and emesis. Pain now in RLQ of abdomen. Presented to [facility] for evaluation - elevated WBC 18K; CTA positive acute appendicitis.   Prior to Admission medications   Medication Sig Start Date End Date Taking? Authorizing Provider  acetaminophen (TYLENOL) 325 MG tablet Take 2 tablets (650 mg total) by mouth every 6 (six) hours as needed for mild pain, moderate pain or fever. 07/30/14   Sherrie George, PA-C  HYDROcodone-acetaminophen (NORCO/VICODIN) 5-325 MG per tablet Take 1-2 tablets by mouth every 4 (four) hours as needed for moderate pain. 07/30/14   Sherrie George, PA-C  ibuprofen (ADVIL,MOTRIN) 200 MG tablet You can take 2-3 tablets every 6 hours as needed for pain 07/30/14   Sherrie George, PA-C  Multiple Vitamin (MULTIVITAMIN WITH MINERALS) TABS tablet Take 1 tablet by mouth daily.   Yes Historical Provider, MD  OLANZapine (ZYPREXA PO) Take 2 tablets by mouth 2 (two) times daily.   Yes Historical Provider, MD   He was admitted thru ED and [facility].  He was taken to the OR that evening and had surgery.  He has done well post op and tolerating a diet.  We plan discharge home later this AM when he has made arrangements to go home.  Of note he is on Zyprexa, and has an odd affect, had a hard time keeping him in room and allowing staff to work.  I have no real diagnosis or records of past medical history.  Condition on D/C:  Improved        Disposition: Final discharge disposition not  confirmed     Medication List    TAKE these medications        acetaminophen 325 MG tablet  Commonly known as:  TYLENOL  Take 2 tablets (650 mg total) by mouth every 6 (six) hours as needed for mild pain, moderate pain or fever.     HYDROcodone-acetaminophen 5-325 MG per tablet  Commonly known as:  NORCO/VICODIN  Take 1-2 tablets by mouth every 4 (four) hours as needed for moderate pain.     ibuprofen 200 MG tablet  Commonly known as:  ADVIL,MOTRIN  You can take 2-3 tablets every 6 hours as needed for pain     multivitamin with minerals Tabs tablet  Take 1 tablet by mouth daily.     ZYPREXA PO  Take 2 tablets by mouth 2 (two) times daily.           Follow-up Information    Call [facility] OFFICE [place].   Why:  Call on Friday, if you do not hear from the office by that time and ask for an appointment post op 3 weeks.   Contact information:   [address] [place] [address] [phone]      Signed: Sherrie George 07/30/2014, 11:17 AM

## 2014-07-30 NOTE — Progress Notes (Signed)
Discharge instructions reviewed with patient utilizing the pacific interpreter hotline, as well as teach back method. Patient  Discharged to home with father.

## 2015-12-28 IMAGING — CT CT ABD-PELV W/ CM
2 of 4 series · 16 of 46 positions shown, 18 images · IV contrast (APPLIED)
Comparison: None.

CLINICAL DATA: Woke up with right lower quadrant abdominal pain
this morning. Emesis.

EXAM:
CT ABDOMEN AND PELVIS WITH CONTRAST
TECHNIQUE: Multidetector CT imaging of the abdomen and pelvis was performed
using the standard protocol following bolus administration of
intravenous contrast.
CONTRAST:  100mL OMNIPAQUE IOHEXOL 300 MG/ML SOLN, 25mL OMNIPAQUE
IOHEXOL 300 MG/ML SOLN

[Series 2: abd/pelvis 5.0 b31f · axial · 0.75mm/px · z∈[-495,-55]mm · 13 of 96 slices shown, 15 images]
[im 4/96  soft-tissue]
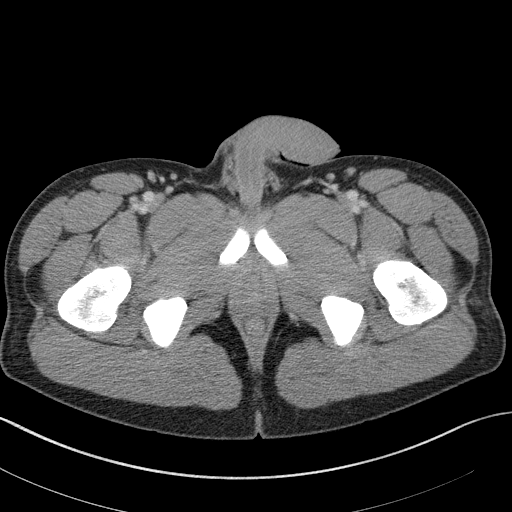
[im 4/96  bone]
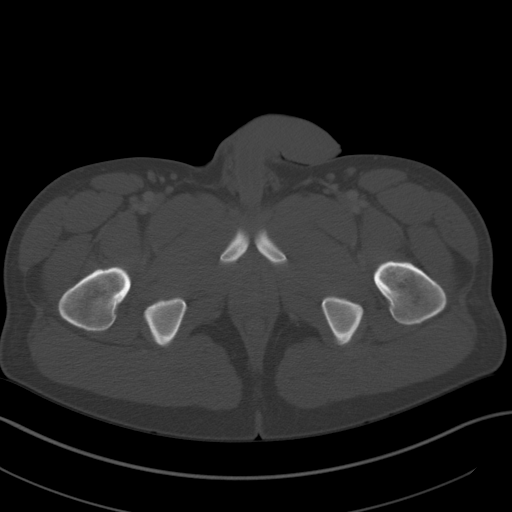
[im 11/96  soft-tissue]
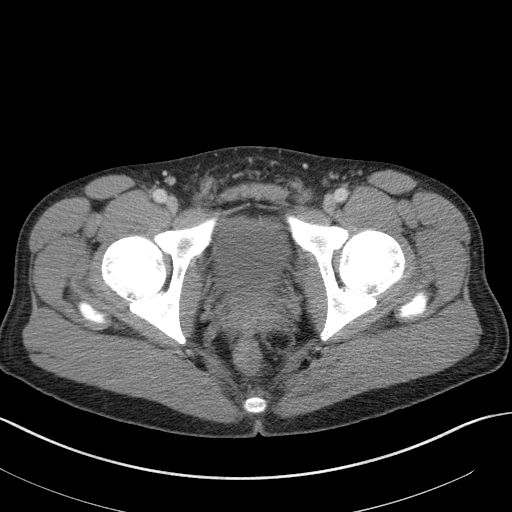
[im 19/96  soft-tissue]
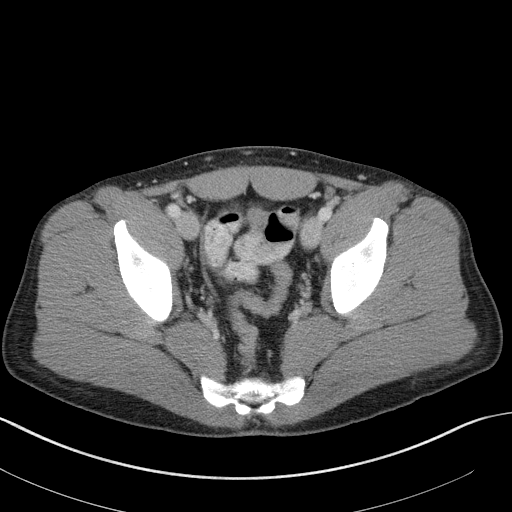
[im 26/96  soft-tissue]
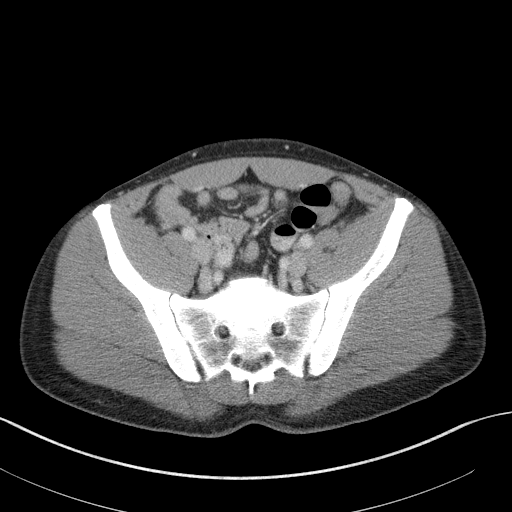
[im 33/96  soft-tissue]
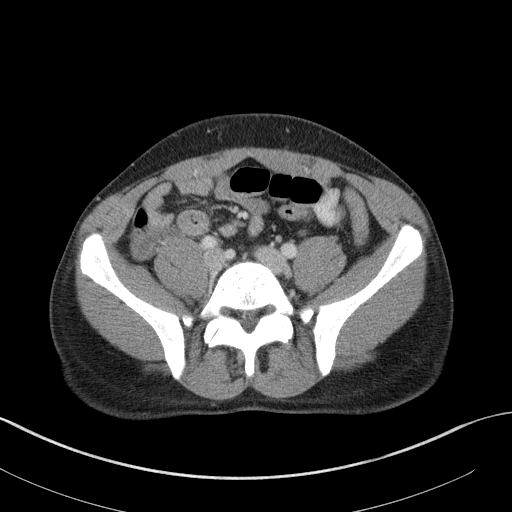
[im 41/96  soft-tissue]
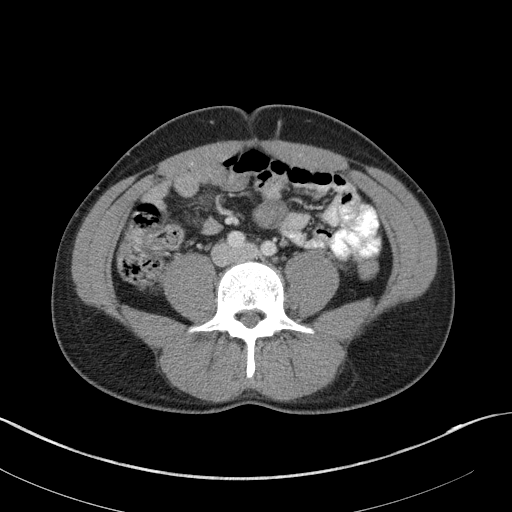
[im 48/96  soft-tissue]
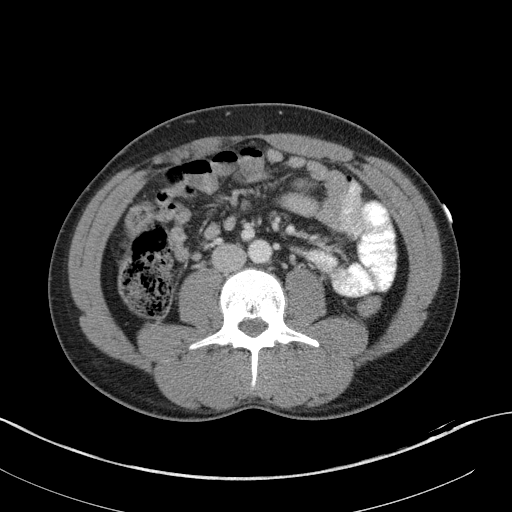
[im 55/96  soft-tissue]
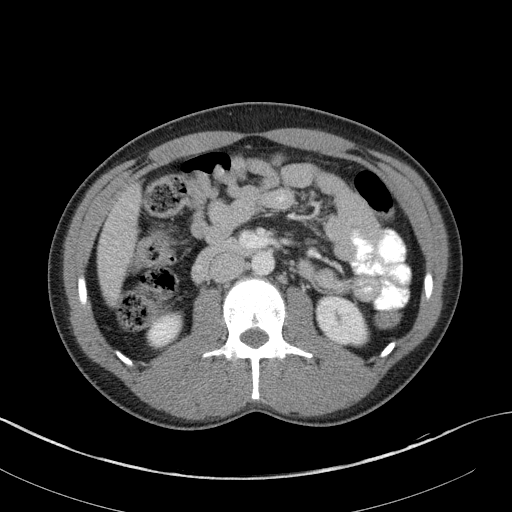
[im 63/96  soft-tissue]
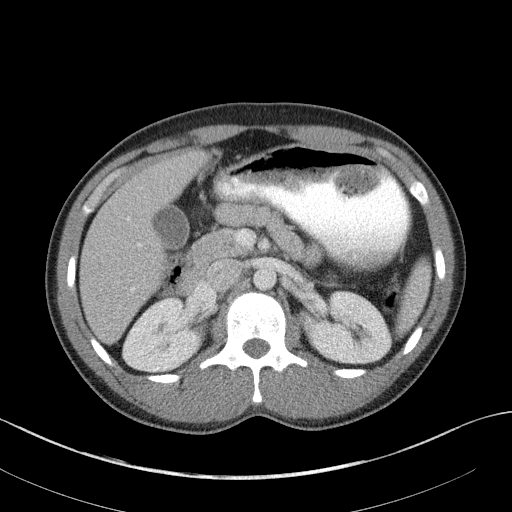
[im 63/96  bone]
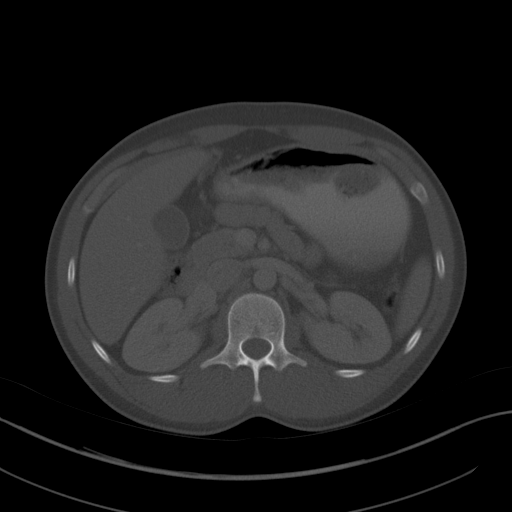
[im 70/96  soft-tissue]
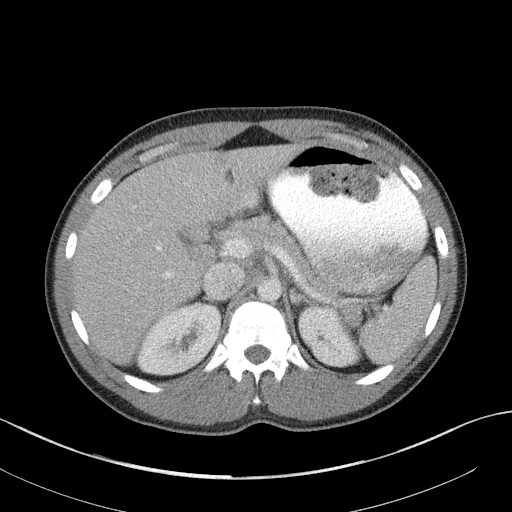
[im 77/96  soft-tissue]
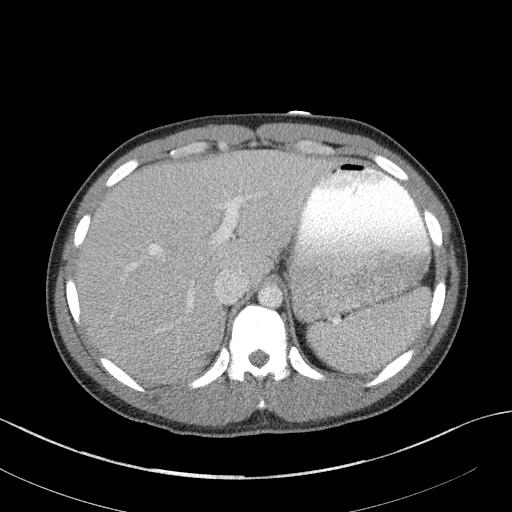
[im 85/96  soft-tissue]
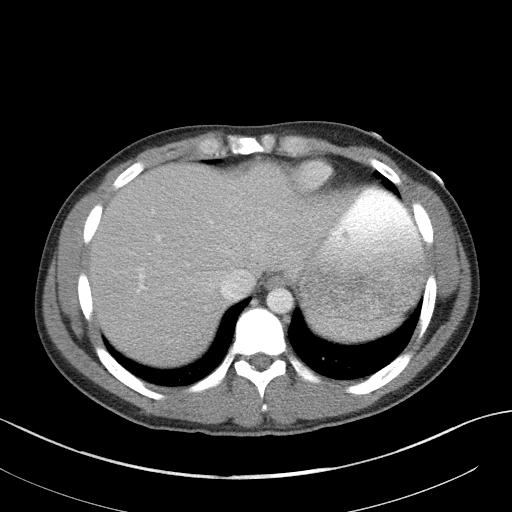
[im 92/96  soft-tissue]
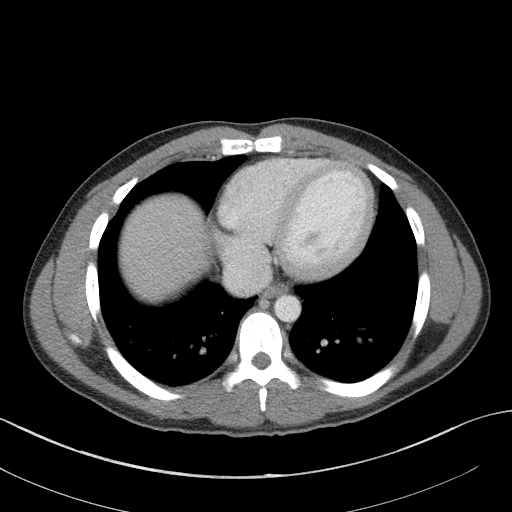

[Series 5: abd/pelvis 3.0 coronal · coronal · 0.74mm/px · 3 of 82 slices shown]
[im 28/82  soft-tissue]
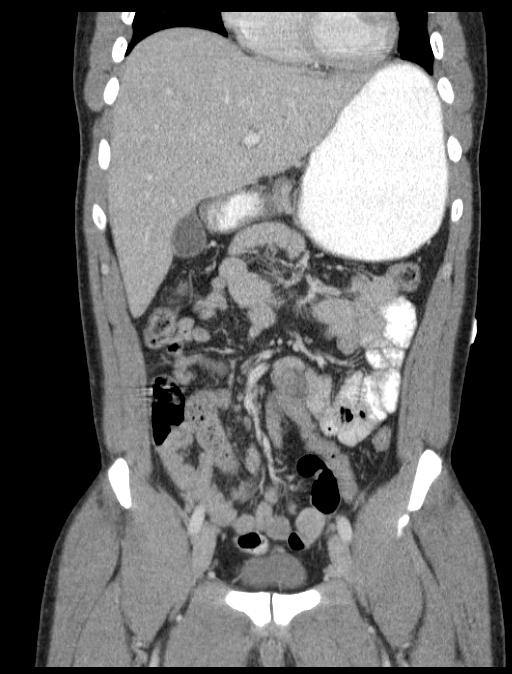
[im 37/82  soft-tissue]
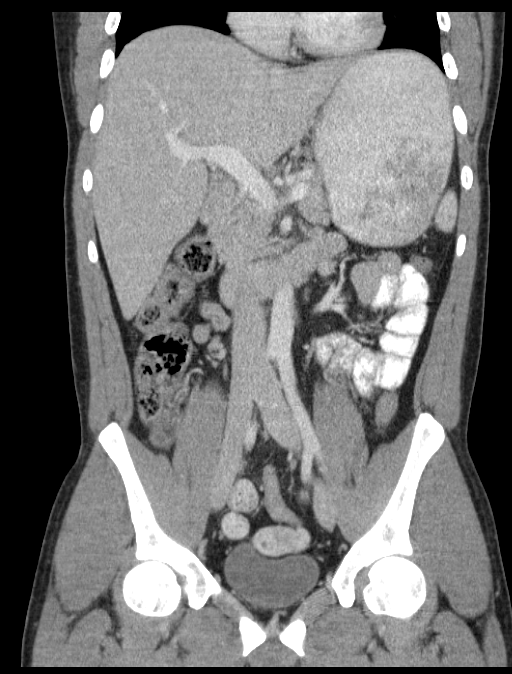
[im 46/82  soft-tissue]
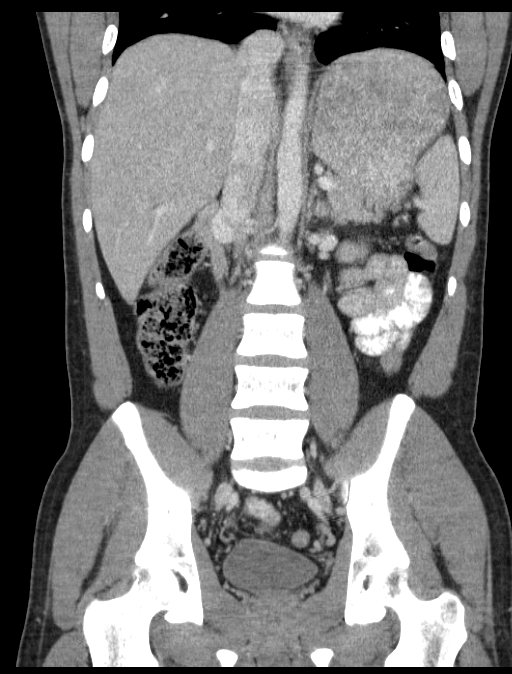

[16 of 46 positions shown; findings below may reference images not displayed]

FINDINGS: Lower chest: No pulmonary nodules, pleural effusions, or
infiltrates. Heart size is normal. No imaged pericardial effusion or
significant coronary artery calcifications.

Upper abdomen: No focal abnormality identified within the liver,
spleen, pancreas, or adrenal glands. Gallbladder is present. Kidneys
are symmetric. No hydronephrosis.

Gastrointestinal tract: Stomach and small bowel loops are normal in
appearance. Adjacent to the cecum there is a short tubular structure
likely representing a dilated appendix. Appendix measures 11 mm
There is a small amount of stranding in this region. Findings are
consistent with acute appendicitis. The colonic loops are normal in
appearance.

Pelvis: Urinary bladder, prostate, and seminal vesicles are normal
in appearance. No free pelvic fluid.

Retroperitoneum: No retroperitoneal or mesenteric adenopathy. No
evidence for aortic aneurysm.

Abdominal wall: Unremarkable.

Osseous structures: Unremarkable.
IMPRESSION: 1. Findings consistent with acute appendicitis.
2. No associated abscess.
3. No free pelvic fluid.

The salient findings were discussed with Dr. Omayra 07/29/2014 at
[DATE].
